# Patient Record
Sex: Female | Born: 1970 | Race: White | Hispanic: No | Marital: Married
Health system: Midwestern US, Academic
[De-identification: ages and names within clinical notes are randomized; demographics above are authoritative.]

---

## 2016-02-08 IMAGING — MG MAMMO DIGITAL SCREEN BILAT
4 series · 4 of 4 positions shown · non-contrast
Comparison: None.

` 
EXAM: Bilateral digital screening mammogram with computer-aided detection (CAD).` 
`
INDICATION: Screening mammogram.` 

[R CC]
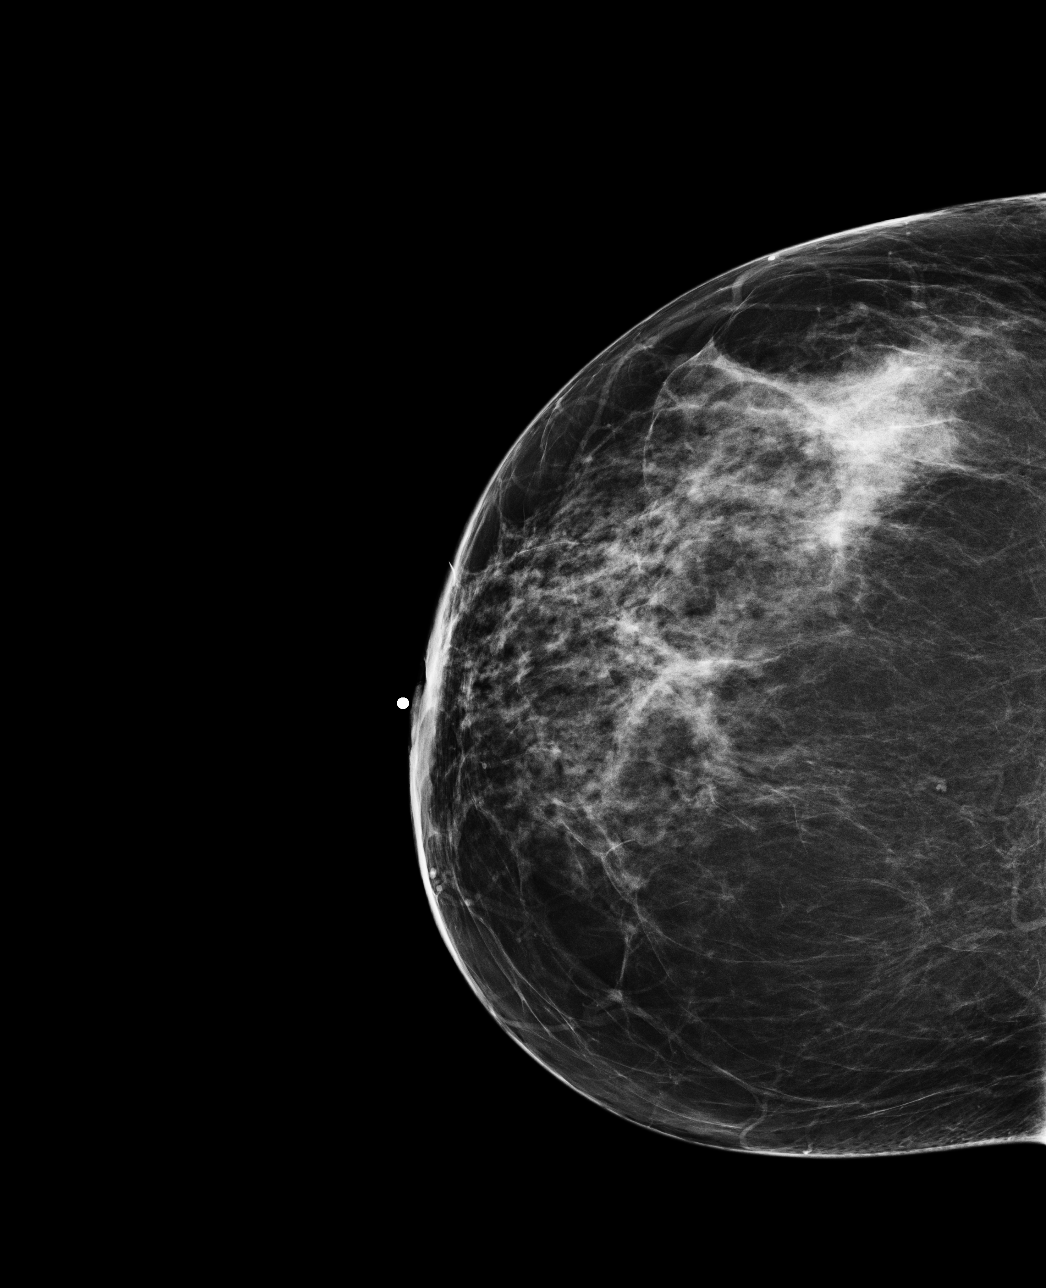

[L CC]
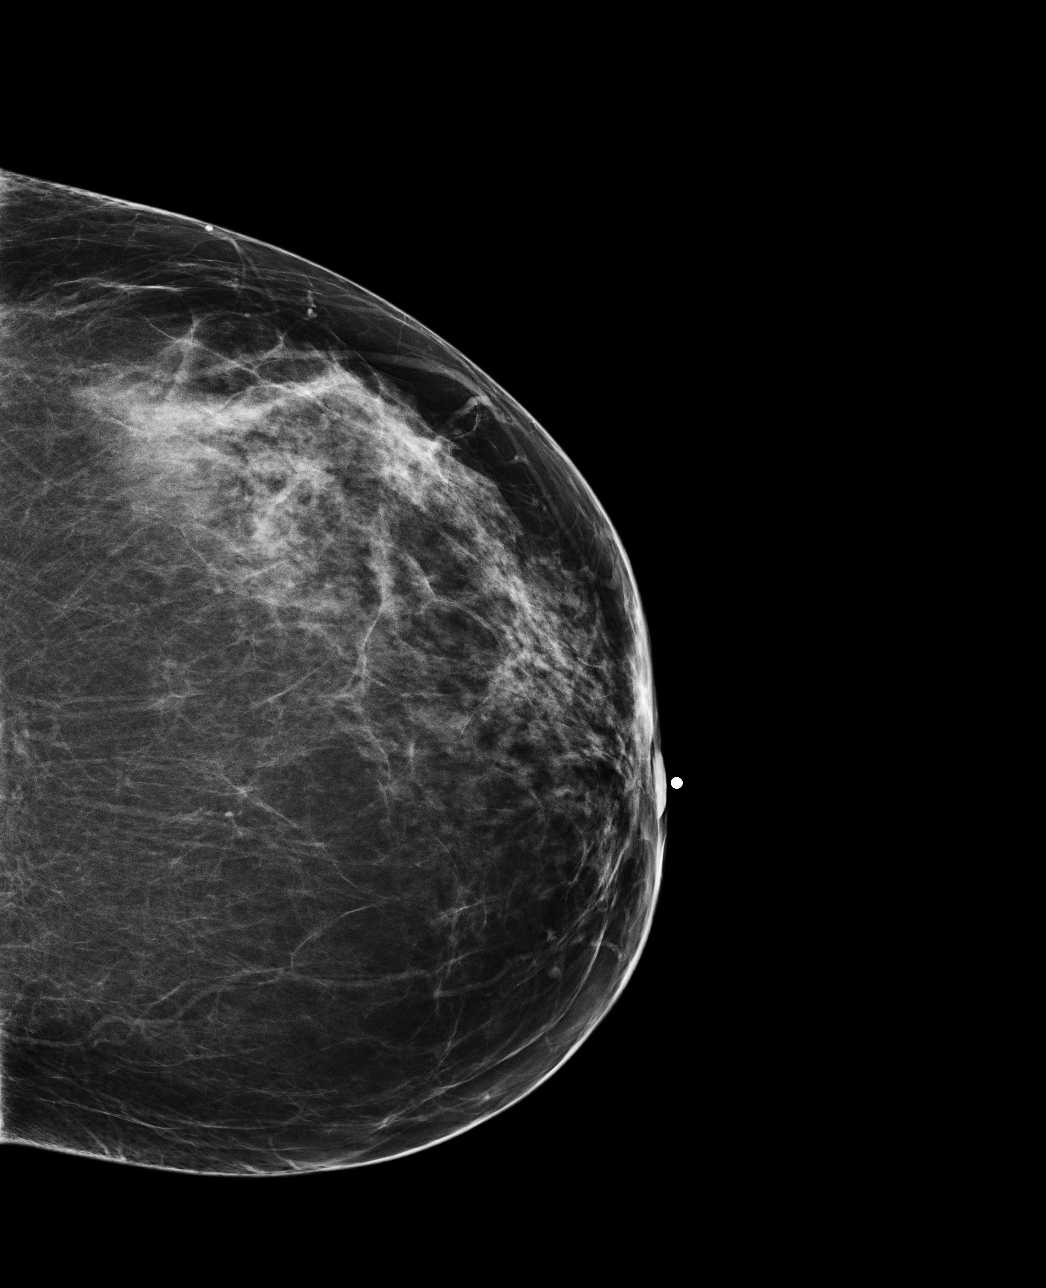

[L MLO]
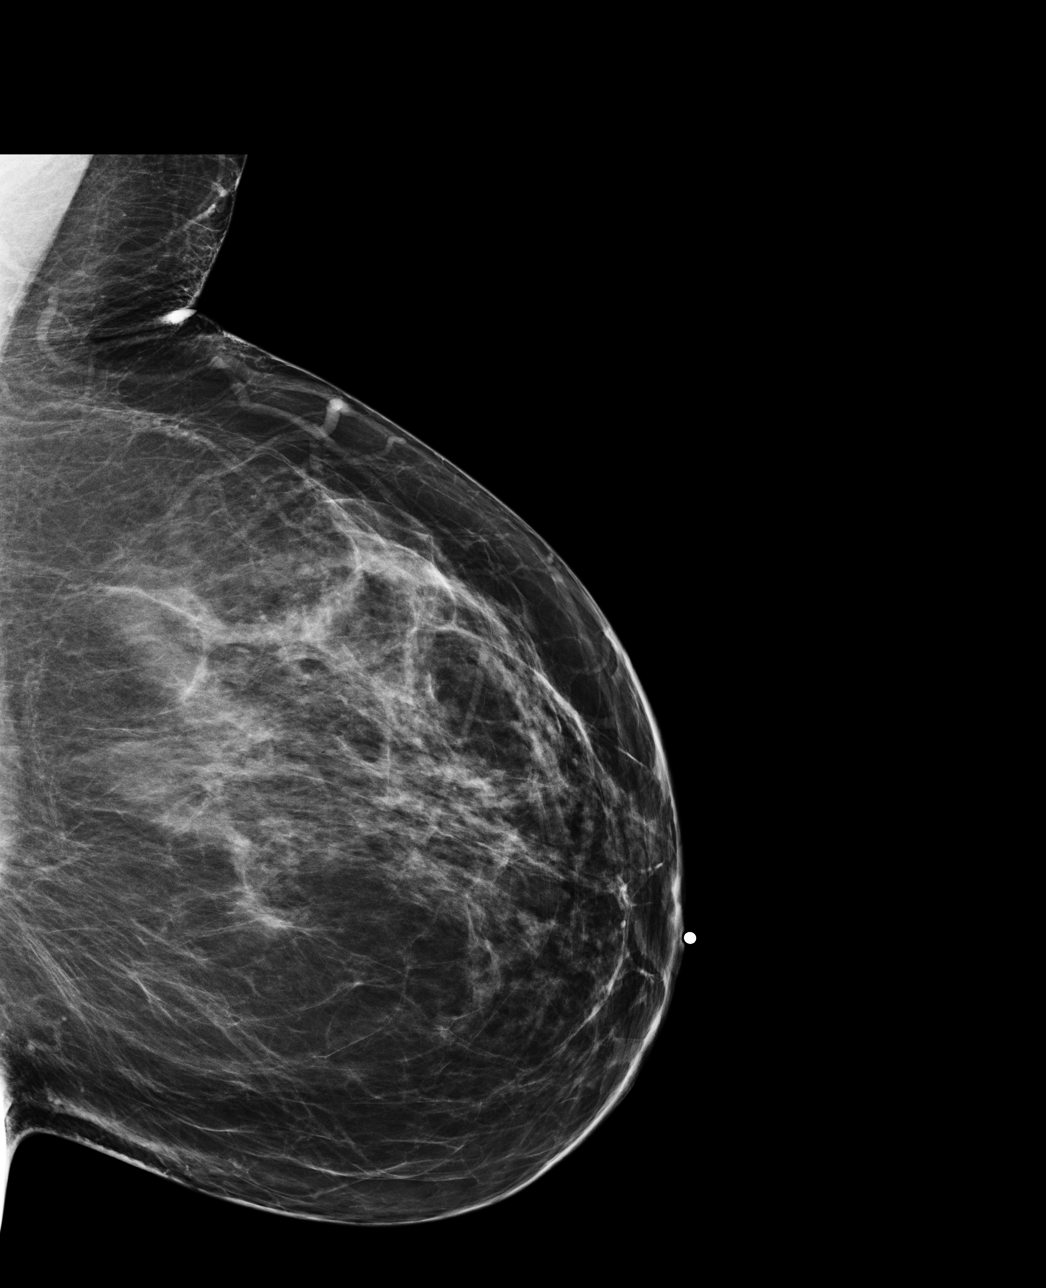

[R MLO]
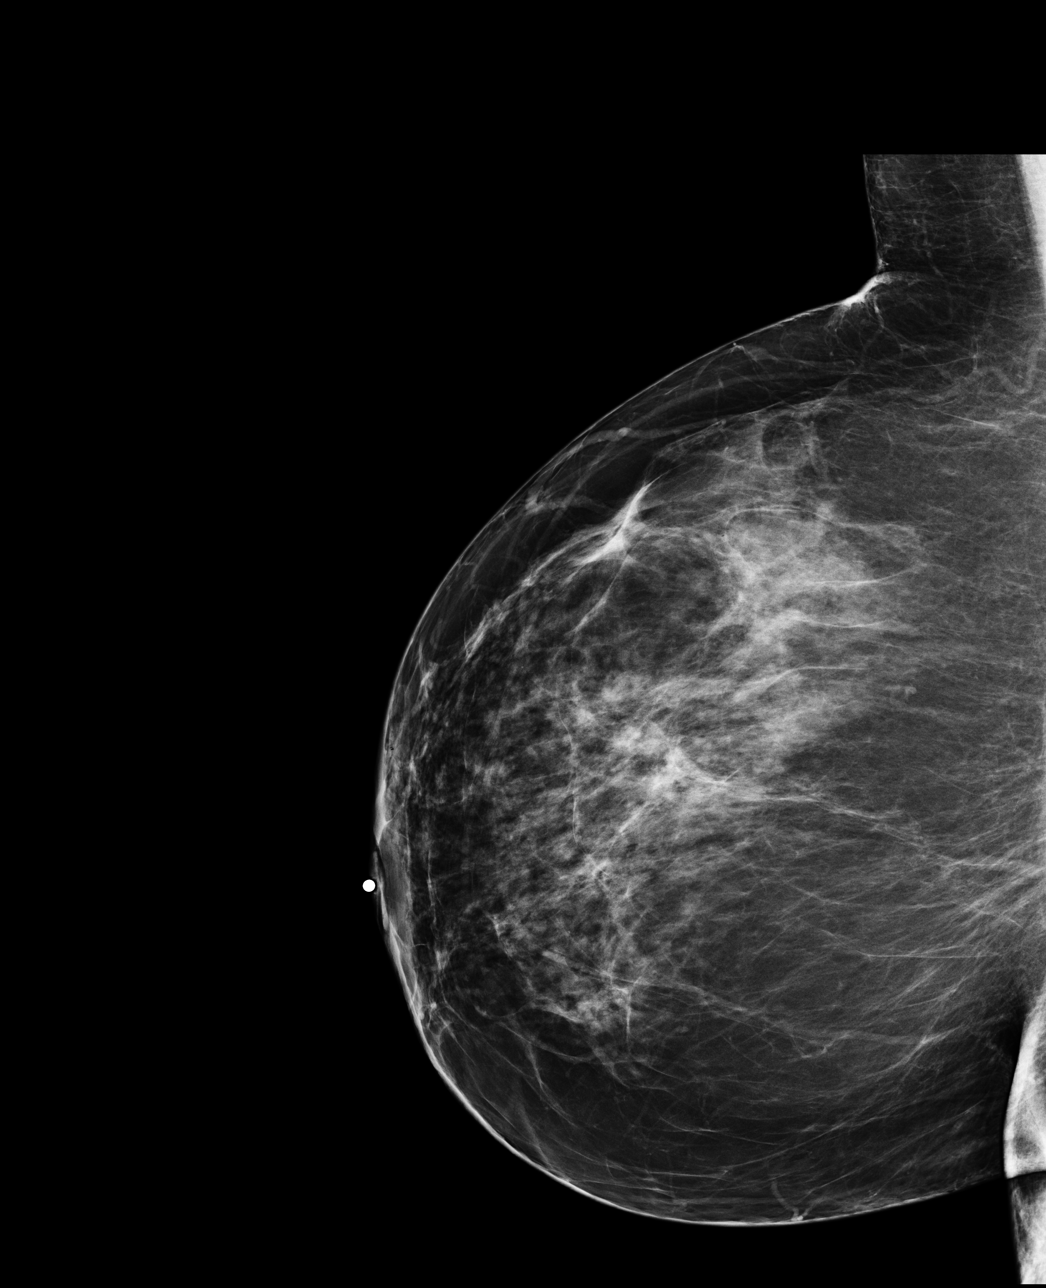

[4 of 4 positions shown; findings below may reference images not displayed]

Baseline exam.` 
` 
PROCEDURE: Bilateral MLO and CC digital mammogram views obtained with CAD.` 
`
FINDINGS: `
Breast Density: 25-50% B.`
There are scattered fibroglandular densities identified bilaterally. No evidence of spiculated mass or `
malignant calcifications present in either breast. Nipples, skin and axilla are unremarkable.`
`
IMPRESSION: No mammographic findings suspicious for malignancy in either breast. Annual screening ` 
mammograms are recommended.`
` 
BI RADS 1- Negative`
` 
clinically suspicious abnormality."`
` 
` 
` 
Baseline screening exam.`

## 2016-06-18 ENCOUNTER — Ambulatory Visit: Admit: 2016-06-18 | Discharge: 2016-06-19 | Payer: MEDICAID

## 2016-06-18 ENCOUNTER — Encounter: Admit: 2016-06-18 | Discharge: 2016-06-18 | Payer: BC Managed Care – PPO

## 2016-06-18 DIAGNOSIS — I10 Essential (primary) hypertension: ICD-10-CM

## 2016-06-18 DIAGNOSIS — B059 Measles without complication: ICD-10-CM

## 2016-06-18 DIAGNOSIS — B009 Herpesviral infection, unspecified: ICD-10-CM

## 2016-06-18 DIAGNOSIS — D699 Hemorrhagic condition, unspecified: Principal | ICD-10-CM

## 2016-06-18 DIAGNOSIS — G43709 Chronic migraine without aura, not intractable, without status migrainosus: ICD-10-CM

## 2016-06-18 DIAGNOSIS — B999 Unspecified infectious disease: ICD-10-CM

## 2016-06-18 DIAGNOSIS — N390 Urinary tract infection, site not specified: ICD-10-CM

## 2016-06-18 DIAGNOSIS — J45909 Unspecified asthma, uncomplicated: ICD-10-CM

## 2016-06-18 DIAGNOSIS — D649 Anemia, unspecified: ICD-10-CM

## 2016-06-18 MED ORDER — BOTULINUM TOXIN TYPE A 100 UNIT/ML INJECTION (OR)
200 [IU] | Freq: Once | 0 refills | Status: CP
Start: 2016-06-18 — End: ?
  Administered 2016-06-18: 14:00:00 200 [IU]

## 2016-06-18 NOTE — Progress Notes
Subjective: Audrey Hubbard is a 45 y.o. female here today for Botox for chronic migraines.     This is Botox injection #2.  At last visit she noted 20 migraine days per month.  Please see previous note from 02/26/16 and 03/26/16 for details.  She says that she had 2-3 severe migraines. She says other than that, she has had headache free days up to 8 days per month, but she says that recently this has been triggered by allergies. She says in the last week with wearing off, she really noted a big difference in her intensity of migraines.     Do you understand the risks of botox as discussed in the consent and wish to proceed?   Patient states yes.      Are you currently pregnant or trying to become pregnant, as botox is not indicated while pregnant? Patient states no.    Vitals:    06/18/16 0916   BP: 141/84   Pulse: 73   SpO2: 100%   Weight: 106.1 kg (234 lb)   Height: 152.4 cm (60)       Botox Injection Procedure  Previous Injection Date: 03/26/16  Information given verbally to the patient  today  included, but was not limited to:    Most common side effects: neck pain, headache, eyelid ptosis, migraine, muscular weakness, musculoskeletal stiffness, bronchitis, injection-site pain, musculoskeletal pain, myalgia, facial paresis, hypertension, muscle spasms; infection at injections sites, bruising, bleeding    Most serious side effects/risks: anaphylaxis, dysphagia,  arrhythmia, myocardial infarction, and in some cases, spontaneous death    Consent form was signed.    Confirmed: patient, procedure, side, site, safety procedures followed.     Performed by: Virgel Bouquet, MD     Preparation: no contraindications noted to Botox, possible medications prior to procedure:  topical numbing cream prior to procedure, Preparation of site with alcohol    Procedure performed:   Indication: Chronic Migraine Headaches  Medication: Onabotulinum toxin A  2.84ml normal saline as diluent /100 units (Botulinum Toxin 5 units per 0.55mL, 200 units prepared, 155 units used, 45 units of waste)    Location: PREEMPT protocol Jerrell Belfast SK, and coauthors. Cephalalgia, 2010;30:793)  Muscles/Sites Injected  A - Bilateral Corrugator - 10 units divided in 2 sites  B - Midline Procerus - 5 units in 1 site  C - Bilateral Frontalis - 20 units divided in 4 sites  D - Bilateral Temporalis - 40 units divided in 8 sites  E - Bilateral Occipitalis - 30 units divided in 6 sites  F - Bilateral Cervical Paraspinals - 20 units divided in 4 sites  G - Bilateral Trapezius - 30 units divided in 6 sites    Total Dose - 155 Units divided in 31 sites    Lot/Expiration: [id number], 12/2018 - both vials    Procedure tolerated: well.    Complications: none.  Diagnosis - Chronic Migraine Headaches   Course:  Progressing as expected.    Counseled: Patient/Family, Regarding diagnosis, Regarding treatment, Regarding medications. If any serious side effects occur, the patient has been instructed to go to the nearest emergency room and call our office.    Follow up: as scheduled prior to next Botox administration.

## 2016-06-19 DIAGNOSIS — G43709 Chronic migraine without aura, not intractable, without status migrainosus: Principal | ICD-10-CM

## 2016-09-16 ENCOUNTER — Encounter: Admit: 2016-09-16 | Discharge: 2016-09-16 | Payer: BC Managed Care – PPO

## 2016-09-16 DIAGNOSIS — G43709 Chronic migraine without aura, not intractable, without status migrainosus: Principal | ICD-10-CM

## 2016-09-16 NOTE — Telephone Encounter
Returned pt's message requesting to reschedule last Botox appt and update with new insurance coverage information. Pt rescheduled for 09/24/16 @ 1030 with arrival for check in @ 1015. Pt currently has [employer] [place] as primary coverage and [facility]. Provided pt Virtual Registration phone # and pt will call today to update insurance payor information.

## 2016-09-24 ENCOUNTER — Ambulatory Visit: Admit: 2016-09-24 | Discharge: 2016-09-25 | Payer: BC Managed Care – PPO

## 2016-09-24 ENCOUNTER — Encounter: Admit: 2016-09-24 | Discharge: 2016-09-24 | Payer: BC Managed Care – PPO

## 2016-09-24 DIAGNOSIS — D649 Anemia, unspecified: ICD-10-CM

## 2016-09-24 DIAGNOSIS — B999 Unspecified infectious disease: ICD-10-CM

## 2016-09-24 DIAGNOSIS — B009 Herpesviral infection, unspecified: ICD-10-CM

## 2016-09-24 DIAGNOSIS — D699 Hemorrhagic condition, unspecified: Principal | ICD-10-CM

## 2016-09-24 DIAGNOSIS — G43709 Chronic migraine without aura, not intractable, without status migrainosus: ICD-10-CM

## 2016-09-24 DIAGNOSIS — N390 Urinary tract infection, site not specified: ICD-10-CM

## 2016-09-24 DIAGNOSIS — B059 Measles without complication: ICD-10-CM

## 2016-09-24 DIAGNOSIS — I10 Essential (primary) hypertension: ICD-10-CM

## 2016-09-24 DIAGNOSIS — J45909 Unspecified asthma, uncomplicated: ICD-10-CM

## 2016-09-24 MED ORDER — BOTULINUM TOXIN TYPE A 100 UNIT/ML INJECTION (OR)
200 [IU] | Freq: Once | 0 refills | Status: CP
Start: 2016-09-24 — End: ?
  Administered 2016-09-24: 16:00:00 200 [IU]

## 2016-09-24 NOTE — Progress Notes
Subjective: Audrey Hubbard is a 46 y.o. female here today for Botox for chronic migraines.    Botox Injection #3.   Prior to botox 20 migraines per month.   Last visit after botox #1: 2-3 severe migraines, then headache free days 8 days per month. Intensity was much worse with botox wearing off in week prior to injections.   This visit: She notes with this round she did well with only 2-3 total severe days until wearing off. She had a week of nearly solid migraines. She feels that she is doing really well now. She has fewer daily underlying headaches with just pressure in the frontal area. She does wear glasses, but her astigmatism is worse when she is tired. She says that this sometimes leads to her headaches. She also feels that this interferes with her sleep/wake cycle. She is off her medications right now due to her insurance issues. She does have a higher BP today due to this. She is working on getting in with her PCP soon.     Do you understand the risks of botox as discussed in the consent and wish to proceed?   Patient states yes.    Are you currently pregnant or trying to become pregnant, as botox is not indicated while pregnant? Patient states no.    Vitals:    09/24/16 1042   BP: 150/84   Pulse: 79   SpO2: 98%       Botox Injection Procedure  Previous Injection Date: 06/18/16    Information given verbally to the patient  today  included, but was not limited to:    Most common side effects: neck pain, headache, eyelid ptosis, migraine, muscular weakness, musculoskeletal stiffness, bronchitis, injection-site pain, musculoskeletal pain, myalgia, facial paresis, hypertension, muscle spasms; infection at injections sites, bruising, bleeding    Most serious side effects/risks: anaphylaxis, dysphagia,  arrhythmia, myocardial infarction, and in some cases, spontaneous death    Consent form was signed.    Confirmed: patient, procedure, side, site, safety procedures followed.     Performed by: Virgel Bouquet, MD Preparation: no contraindications noted to Botox, possible medications prior to procedure:  topical numbing cream prior to procedure, Preparation of site with alcohol    Procedure performed:   Indication: Chronic Migraine Headaches  Medication: Onabotulinum toxin A  2.72ml normal saline as diluent /100 units (Botulinum Toxin 5 units per 0.69mL, 200 units prepared, 155 units used, 45 units of waste)    Location: PREEMPT protocol Jerrell Belfast SK, and coauthors. Cephalalgia, 2010;30:793)  Muscles/Sites Injected  A - Bilateral Corrugator - 10 units divided in 2 sites  B - Midline Procerus - 5 units in 1 site  C - Bilateral Frontalis - 20 units divided in 4 sites  D - Bilateral Temporalis - 40 units divided in 8 sites  E - Bilateral Occipitalis - 30 units divided in 6 sites  F - Bilateral Cervical Paraspinals - 20 units divided in 4 sites  G - Bilateral Trapezius - 30 units divided in 6 sites    Total Dose - 155 Units divided in 31 sites    Lot/Expiration: [id number], 01/2019 - both vials    Procedure tolerated: well.    Complications: none.  Diagnosis - Chronic Migraine Headaches   Course:  Progressing as expected.    Counseled: Patient/Family, Regarding diagnosis, Regarding treatment, Regarding medications. If any serious side effects occur, the patient has been instructed to go to the nearest emergency room and call our office.  Follow up: as scheduled prior to next Botox administration.

## 2016-09-25 DIAGNOSIS — G43709 Chronic migraine without aura, not intractable, without status migrainosus: Principal | ICD-10-CM

## 2016-10-24 NOTE — Progress Notes
Subjective:       History of Present Illness    Audrey Hubbard 46 y.o. female is here today for evaluation of headaches.   The patient was a prior patient of Dr. Bryan Lemma in our department.     Onset: childhood  Average number of headache days per month: 20 --> with botox 3-4 severe migraines per month  Location: occipital to frontal - either side, or bilateral  Quality: throbbing vs sharp/shooting  Light and sound sensitivity: positive  Nausea and/or vomiting: some nausea  Triggers: vision changes/astigmatism  Depression or anxiety: denies other than anxiety with her job right now.  She says her blood pressure is much lower 130s/85 at work, but has been low at work. She has not been able to tolerate the bystolic.   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     Abortive tx:  Oxycodone/acetaminophen  Darvacet  Naproxen 500mg   Advil  Sumatriptan - heart racing, light headed, dizzy  Ketorolac 15.75mg  nasal spray - does not take frequently, only works some of the time - stopped this due to frequent naproxen use  Compazine 10mg   Zofran 4mg     PPx tried/length of trial:  Lisinopril (for bp) caused cough  Metoprolol  Topamax - caused emotional changes  Zonegran 50mg  - She says that she stopped this, but she doesn't remember why.  Clonidine -for bp  Doxepin - more for sleep in the past.   Bystolic 10mg  daily - for bp  Magnesium  Botox  She says that she does not want to take any antidepressants. She says that antidepressants upset her more.       MRI brain w/o contrast 08/17/14: normal MRI of the brain.    __________________________________________________________________________________    Past Medical History:   Diagnosis Date   ??? Anemia    ??? Asthma    ??? Bleeding disorder (HCC)    ??? Chronic migraine    ??? Herpes    ??? Hypertension    ??? Infection    ??? Measles    ??? Recurrent UTI      Past Surgical History:   Procedure Laterality Date ??? CHOLECYSTECTOMY  2013   ??? CYSTOSCOPY     ??? EYE SURGERY      foreign body removal age 48.    ??? KNEE CARTILAGE SURGERY  at age 38     Social History     Social History   ??? Marital status: Married     Spouse name: N/A   ??? Number of children: N/A   ??? Years of education: N/A     Occupational History   ??? Not on file.     Social History Main Topics   ??? Smoking status: Never Smoker   ??? Smokeless tobacco: Never Used   ??? Alcohol use 1.8 oz/week     2 Glasses of wine, 1 Cans of beer per week      Comment: occasionally monthly   ??? Drug use: No   ??? Sexual activity: Not on file     Other Topics Concern   ??? Not on file     Social History Narrative   ??? No narrative on file     Family History   Problem Relation Age of Onset   ??? Stroke Mother 56   ??? Hypertension Mother    ??? High Cholesterol Mother    ??? Other Brother  cardiac ablation   ??? Hypertension Father    ??? Diabetes Father    ??? Hypoglycemia Father    ??? Arthritis Maternal Grandmother    ??? Tuberculosis Maternal Grandmother    ??? Heart problem Paternal Grandmother    ??? Dementia Paternal Grandfather      Allergies   Allergen Reactions   ??? Amoxicillin HIVES   ??? Erythromycin VOMITING and UNKNOWN   ??? Penicillins HIVES   ??? Zithromax [Azithromycin] HIVES and ITCHING   ??? Seasonal Allergies UNKNOWN   ??? Topamax [Topiramate] SEE COMMENTS     Crying, word vomiting        Review of Systems   Constitutional: Negative.    HENT: Negative.    Eyes: Positive for photophobia.   Respiratory: Negative.    Cardiovascular: Negative.    Gastrointestinal: Negative.    Genitourinary: Negative.    Musculoskeletal: Positive for arthralgias and back pain.   Skin: Negative.    Allergic/Immunologic: Positive for environmental allergies and food allergies.   Neurological: Positive for headaches.   Psychiatric/Behavioral: Negative.      Objective:         ??? BYSTOLIC 10 mg tablet TAKE 1 TABLET BY MOUTH DAILY   ??? cetirizine-psuedoephedrine (ZYRTEC-D) 5-120 mg tablet Take 1 Tab by mouth as Needed. ??? cloNIDine HCl (CATAPRESS) 0.1 mg tablet Take 0.1 mg by mouth twice daily.   ??? dexlansoprazole (+) (DEXILANT) 60 mg capsule Take 60 mg by mouth daily.   ??? fluticasone (FLONASE) 50 mcg/actuation nasal spray Apply 1 Spray to each nostril as directed daily. Shake bottle gently before using.   ??? hydrochlorothiazide (HYDRODIURIL) 25 mg tablet Take 25 mg by mouth twice daily.   ??? LEG BRACE (ANKLE BRACE MISC) 1 Applicator. 1 application (s) as directed   ??? montelukast (SINGULAIR) 10 mg tablet Take 10 mg by mouth at bedtime daily.   ??? naproxen (NAPROSYN) 500 mg tablet Take 500 mg by mouth every 12 hours as needed for Pain. Take with food.   ??? nitrofurantoin monohyd/m-cryst (MACROBID) 100 mg capsule Take 100 mg by mouth every 12 hours. Take with food.   ??? ondansetron hcl (ZOFRAN) 4 mg tablet Take 4 mg by mouth every 8 hours as needed for Nausea or Vomiting.   ??? other medication 1 Dose. Plexus slim, plexus bio cleanse, plexus mega-x, plexas probio 5, plexus x factor, plexus ease, plexus nerve   ??? oxyCODONE/acetaminophen (PERCOCET; ENDOCET; ROXICET) 5/325 mg tablet Take 1 Tab by mouth twice daily as needed for Pain   ??? potassium chloride SR (K-DUR) 10 mEq tablet Take 10 mEq by mouth as Needed. Take with a meal and a full glass of water.   ??? prochlorperazine maleate (COMPAZINE) 10 mg tablet Take 1 Tab by mouth every 8 hours as needed.   ??? valACYclovir (VALTREX) 1 gram tablet Take 1,000 mg by mouth as Needed.     Vitals:    10/28/16 0758   BP: 148/80   Pulse: 79   SpO2: 100%   Weight: 116.3 kg (256 lb 6.4 oz)   Height: 152.4 cm (60)     Body mass index is 50.07 kg/m???.     Physical Exam    General: alert, oriented x 3  Speech: normal, no dysarthria  Cardiovascular: regular rate and rhythm  Lungs: clear to auscultation  Ext: no edema  Cranial nerves: ophthalmoscopic exam: no papilledema or optic pallor; II Visual fields full to finger counting; III, IV, VI PERRL, extraocular muscles intact, no nystagmus,  right ptosis; V facial sensation intact; VII facial expression symmetric; VIII hearing intact to conversation; IX, X palate rise symmetric; XI sternocleidomastoid strength intact; XII tongue midline  Motor: (Right/Left)  Deltoid 5/5, Biceps 5/5, Triceps 5/5, Finger ext 5/5, interossei 5/5, Hip Flexion 5/5, Knee ext 5/5, Knee flex 5/5, Ankle dorsiflexion 5/5  Reflexes: (Right/Left) Biceps 2/2, Triceps 2/2, Brachioradialis 2/2, Knee Jerks 2/2  Abnormal Movements: no tremors, no rigidity  Gait: normal based, good arm swing       Assessment and Plan:  Audrey Hubbard is a 46 y.o. female who presents for evaluation of headache. She notes h/o chronic migraine since childhood, has been 15-20 or more days per month, unilateral to bilateral throbbing/sharp pain, + photo/phono, + nausea.     1. Chronic migraine w/o aura w/o status migrainosus, not intractable       RECOMMENDATIONS  -- Continue botox every 12 weeks  -- Discussed gammacore, but will wait until her blood pressure is under better control.   -- Please work with your PCP on your blood pressure (as discussed you are working on already).  -- When needed for severe headaches, trial of naproxen 500mg , compazine 5-10mg , benadryl 25mg , magnesium 400mg  (no more than 10/month)    FOLLOWUP PLAN  Return in about 8 months (around 06/28/2017) for follow up headaches.  The patient is instructed to contact me if there are any concerns with the agreed plan.

## 2016-10-28 ENCOUNTER — Ambulatory Visit: Admit: 2016-10-28 | Discharge: 2016-10-29 | Payer: BC Managed Care – PPO

## 2016-10-28 ENCOUNTER — Encounter: Admit: 2016-10-28 | Discharge: 2016-10-28 | Payer: BC Managed Care – PPO

## 2016-10-28 DIAGNOSIS — D649 Anemia, unspecified: ICD-10-CM

## 2016-10-28 DIAGNOSIS — B059 Measles without complication: ICD-10-CM

## 2016-10-28 DIAGNOSIS — B999 Unspecified infectious disease: ICD-10-CM

## 2016-10-28 DIAGNOSIS — N390 Urinary tract infection, site not specified: ICD-10-CM

## 2016-10-28 DIAGNOSIS — G43709 Chronic migraine without aura, not intractable, without status migrainosus: ICD-10-CM

## 2016-10-28 DIAGNOSIS — B009 Herpesviral infection, unspecified: ICD-10-CM

## 2016-10-28 DIAGNOSIS — J45909 Unspecified asthma, uncomplicated: ICD-10-CM

## 2016-10-28 DIAGNOSIS — D699 Hemorrhagic condition, unspecified: Principal | ICD-10-CM

## 2016-10-28 DIAGNOSIS — I10 Essential (primary) hypertension: ICD-10-CM

## 2016-10-29 DIAGNOSIS — G43709 Chronic migraine without aura, not intractable, without status migrainosus: Principal | ICD-10-CM

## 2016-12-11 NOTE — Progress Notes
Subjective: Audrey Hubbard 46 y.o. female is here today for botox for chronic migraines.      This is Botox No. 4.  Prior to Botox she had 20 migraines per month. After injections, she had only 2-3 total severe migraines until wearing off in the last 1 week with nearly solid headaches.   She had some still daily underlying headaches or pressure in the frontal area.  We did discuss the potential to try gamma core, but wanted to make sure her blood pressure was under better control before we did so.  She is working on her blood pressure control.     Do you understand the risks and benefits of botox for migraine and wish to proceed?  Yes.    Vitals:    12/16/16 1902   BP: 148/70   Pulse: 82   Resp: 16   Weight: 106.1 kg (234 lb)   Height: 152.4 cm (60)     Botox Injection Procedure   Previous Injection Date: 09/24/16  Informed consent given verbally to the patient today to include, but not limited to:  Most common side effects: neck pain, headache, eyelid ptosis, migraine, muscular weakness, musculoskeletal stiffness, bronchitis, injection-site pain, musculoskeletal pain, myalgia, facial paresis, hypertension, and muscle spasms; infection at injections sites, bruising, bleeding  Most serious side effects/risks: anaphylaxis, dysphagia, pneumonia, arrhythmia, myocardial infarction, and in some cases, spontaneous death    Confirmed: patient, procedure, side, site, safety procedures followed.     Performed by: Marvis Moeller, MD.     Preparation: no contraindications noted to Botox, possible medications prior to procedure Emla cream 2.5%/2.5% - 2g topically prior to procedure, Tylenol, Preparation of site with alcohol    Procedure performed:   Indication: Chronic Migraine Headaches  Medication: Onabotulinum toxin A  2.5 ml/100 units (Botulinum Toxin 5 units per 0.90mL, 200 units prepared, 165 units used, 35 units of waste)    Location: PREEMPT protocol Jerrell Belfast SK, and coauthors. Cephalalgia, 2010;30:793) Muscles/Sites Injected  A - Bilateral Corrugator - 10 units divided in 2 sites  B - Midline Procerus - 5 units in 1 site  C - Bilateral Frontalis - 20 units divided in 4 sites  D - Bilateral Temporalis - 45 units divided in 8 sites - extra 5 in right temporalis  E - Bilateral Occipitalis - 30 units divided in 6 sites  F - Bilateral Cervical Paraspinals - 20 units divided in 4 sites  G - Bilateral Trapezius - 35 units divided in 6 sites - extra 5 in left trapezius    Total Dose - 165 Units divided in 31 sites    Lot/Expiration: [id number], 04/2019 - both vials    Procedure tolerated: well.     Complications: none.    Diagnosis ??? Chronic Migraine Headaches   Course:  Progressing as expected.    Counseled:  Patient/Family, Regarding diagnosis, Regarding treatment, Regarding medications. If any serious side effects occur, the patient has been instructed to go to the nearest emergency room and call our office.  Follow up: as scheduled - call with any concerns

## 2016-12-16 ENCOUNTER — Encounter: Admit: 2016-12-16 | Discharge: 2016-12-16 | Payer: BC Managed Care – PPO

## 2016-12-16 DIAGNOSIS — G43709 Chronic migraine without aura, not intractable, without status migrainosus: ICD-10-CM

## 2016-12-16 DIAGNOSIS — D649 Anemia, unspecified: ICD-10-CM

## 2016-12-16 DIAGNOSIS — J45909 Unspecified asthma, uncomplicated: ICD-10-CM

## 2016-12-16 DIAGNOSIS — B059 Measles without complication: ICD-10-CM

## 2016-12-16 DIAGNOSIS — B009 Herpesviral infection, unspecified: ICD-10-CM

## 2016-12-16 DIAGNOSIS — N390 Urinary tract infection, site not specified: ICD-10-CM

## 2016-12-16 DIAGNOSIS — D699 Hemorrhagic condition, unspecified: Principal | ICD-10-CM

## 2016-12-16 DIAGNOSIS — I10 Essential (primary) hypertension: ICD-10-CM

## 2016-12-16 DIAGNOSIS — B999 Unspecified infectious disease: ICD-10-CM

## 2016-12-16 MED ORDER — BOTULINUM TOXIN TYPE A 100 UNIT/ML INJECTION (OR)
200 [IU] | Freq: Once | 0 refills | Status: CP
Start: 2016-12-16 — End: ?
  Administered 2016-12-17: 01:00:00 200 [IU]

## 2016-12-17 ENCOUNTER — Ambulatory Visit: Admit: 2016-12-17 | Discharge: 2016-12-17 | Payer: BC Managed Care – PPO

## 2016-12-17 DIAGNOSIS — G43709 Chronic migraine without aura, not intractable, without status migrainosus: Principal | ICD-10-CM

## 2017-03-17 ENCOUNTER — Ambulatory Visit: Admit: 2017-03-17 | Discharge: 2017-03-18 | Payer: BC Managed Care – PPO

## 2017-03-17 ENCOUNTER — Encounter: Admit: 2017-03-17 | Discharge: 2017-03-17 | Payer: BC Managed Care – PPO

## 2017-03-17 DIAGNOSIS — I10 Essential (primary) hypertension: ICD-10-CM

## 2017-03-17 DIAGNOSIS — B999 Unspecified infectious disease: ICD-10-CM

## 2017-03-17 DIAGNOSIS — N390 Urinary tract infection, site not specified: ICD-10-CM

## 2017-03-17 DIAGNOSIS — B059 Measles without complication: ICD-10-CM

## 2017-03-17 DIAGNOSIS — D699 Hemorrhagic condition, unspecified: Principal | ICD-10-CM

## 2017-03-17 DIAGNOSIS — J45909 Unspecified asthma, uncomplicated: ICD-10-CM

## 2017-03-17 DIAGNOSIS — D649 Anemia, unspecified: ICD-10-CM

## 2017-03-17 DIAGNOSIS — G43709 Chronic migraine without aura, not intractable, without status migrainosus: ICD-10-CM

## 2017-03-17 DIAGNOSIS — B009 Herpesviral infection, unspecified: ICD-10-CM

## 2017-03-17 MED ORDER — BOTULINUM TOXIN TYPE A 100 UNIT/ML INJECTION (OR)
200 [IU] | Freq: Once | 0 refills | Status: CP
Start: 2017-03-17 — End: ?

## 2017-03-18 DIAGNOSIS — G43709 Chronic migraine without aura, not intractable, without status migrainosus: Principal | ICD-10-CM

## 2017-06-11 ENCOUNTER — Encounter: Admit: 2017-06-11 | Discharge: 2017-06-11 | Payer: BC Managed Care – PPO

## 2017-06-11 DIAGNOSIS — N390 Urinary tract infection, site not specified: ICD-10-CM

## 2017-06-11 DIAGNOSIS — D649 Anemia, unspecified: ICD-10-CM

## 2017-06-11 DIAGNOSIS — H21501 Unspecified adhesions of iris, right eye: ICD-10-CM

## 2017-06-11 DIAGNOSIS — J45909 Unspecified asthma, uncomplicated: ICD-10-CM

## 2017-06-11 DIAGNOSIS — G43709 Chronic migraine without aura, not intractable, without status migrainosus: ICD-10-CM

## 2017-06-11 DIAGNOSIS — B059 Measles without complication: ICD-10-CM

## 2017-06-11 DIAGNOSIS — B009 Herpesviral infection, unspecified: ICD-10-CM

## 2017-06-11 DIAGNOSIS — B999 Unspecified infectious disease: ICD-10-CM

## 2017-06-11 DIAGNOSIS — I10 Essential (primary) hypertension: ICD-10-CM

## 2017-06-11 DIAGNOSIS — D699 Hemorrhagic condition, unspecified: Principal | ICD-10-CM

## 2017-06-11 MED ORDER — BOTULINUM TOXIN TYPE A 100 UNIT/ML INJECTION (OR)
200 [IU] | Freq: Once | 0 refills | Status: CP
Start: 2017-06-11 — End: ?
  Administered 2017-06-11: 13:00:00 200 [IU]

## 2017-06-12 ENCOUNTER — Ambulatory Visit: Admit: 2017-06-11 | Discharge: 2017-06-12 | Payer: BC Managed Care – PPO

## 2017-06-12 DIAGNOSIS — G43709 Chronic migraine without aura, not intractable, without status migrainosus: Principal | ICD-10-CM

## 2017-06-30 ENCOUNTER — Encounter: Admit: 2017-06-30 | Discharge: 2017-06-30 | Payer: BC Managed Care – PPO

## 2017-06-30 ENCOUNTER — Ambulatory Visit: Admit: 2017-06-30 | Discharge: 2017-07-01 | Payer: BC Managed Care – PPO

## 2017-06-30 DIAGNOSIS — I10 Essential (primary) hypertension: ICD-10-CM

## 2017-06-30 DIAGNOSIS — H21501 Unspecified adhesions of iris, right eye: ICD-10-CM

## 2017-06-30 DIAGNOSIS — G43709 Chronic migraine without aura, not intractable, without status migrainosus: ICD-10-CM

## 2017-06-30 DIAGNOSIS — B059 Measles without complication: ICD-10-CM

## 2017-06-30 DIAGNOSIS — J45909 Unspecified asthma, uncomplicated: ICD-10-CM

## 2017-06-30 DIAGNOSIS — B009 Herpesviral infection, unspecified: ICD-10-CM

## 2017-06-30 DIAGNOSIS — N390 Urinary tract infection, site not specified: ICD-10-CM

## 2017-06-30 DIAGNOSIS — D699 Hemorrhagic condition, unspecified: Principal | ICD-10-CM

## 2017-06-30 DIAGNOSIS — D649 Anemia, unspecified: ICD-10-CM

## 2017-06-30 MED ORDER — GABAPENTIN 100 MG PO CAP
100 mg | ORAL_CAPSULE | ORAL | 3 refills | Status: AC
Start: 2017-06-30 — End: ?

## 2017-09-03 ENCOUNTER — Encounter: Admit: 2017-09-03 | Discharge: 2017-09-03 | Payer: BC Managed Care – PPO

## 2017-09-03 ENCOUNTER — Ambulatory Visit: Admit: 2017-09-03 | Discharge: 2017-09-04 | Payer: BC Managed Care – PPO

## 2017-09-03 DIAGNOSIS — J45909 Unspecified asthma, uncomplicated: ICD-10-CM

## 2017-09-03 DIAGNOSIS — B059 Measles without complication: ICD-10-CM

## 2017-09-03 DIAGNOSIS — D699 Hemorrhagic condition, unspecified: Principal | ICD-10-CM

## 2017-09-03 DIAGNOSIS — N390 Urinary tract infection, site not specified: ICD-10-CM

## 2017-09-03 DIAGNOSIS — H21501 Unspecified adhesions of iris, right eye: ICD-10-CM

## 2017-09-03 DIAGNOSIS — G43709 Chronic migraine without aura, not intractable, without status migrainosus: Principal | ICD-10-CM

## 2017-09-03 DIAGNOSIS — D649 Anemia, unspecified: ICD-10-CM

## 2017-09-03 DIAGNOSIS — B009 Herpesviral infection, unspecified: ICD-10-CM

## 2017-09-03 DIAGNOSIS — I10 Essential (primary) hypertension: ICD-10-CM

## 2017-09-03 MED ORDER — ONABOTULINUMTOXINA 100 UNIT IJ SOLR
200 [IU] | Freq: Once | 0 refills | Status: CP
Start: 2017-09-03 — End: ?
  Administered 2017-09-03: 13:00:00 200 [IU]

## 2017-11-26 ENCOUNTER — Encounter: Admit: 2017-11-26 | Discharge: 2017-11-26 | Payer: BC Managed Care – PPO

## 2017-11-26 ENCOUNTER — Ambulatory Visit: Admit: 2017-11-26 | Discharge: 2017-11-27 | Payer: BC Managed Care – PPO

## 2017-11-26 DIAGNOSIS — J45909 Unspecified asthma, uncomplicated: ICD-10-CM

## 2017-11-26 DIAGNOSIS — D649 Anemia, unspecified: ICD-10-CM

## 2017-11-26 DIAGNOSIS — I10 Essential (primary) hypertension: ICD-10-CM

## 2017-11-26 DIAGNOSIS — B009 Herpesviral infection, unspecified: ICD-10-CM

## 2017-11-26 DIAGNOSIS — G43709 Chronic migraine without aura, not intractable, without status migrainosus: ICD-10-CM

## 2017-11-26 DIAGNOSIS — N390 Urinary tract infection, site not specified: ICD-10-CM

## 2017-11-26 DIAGNOSIS — B059 Measles without complication: ICD-10-CM

## 2017-11-26 DIAGNOSIS — D699 Hemorrhagic condition, unspecified: Principal | ICD-10-CM

## 2017-11-26 DIAGNOSIS — H21501 Unspecified adhesions of iris, right eye: ICD-10-CM

## 2017-11-26 MED ORDER — ONABOTULINUMTOXINA 100 UNIT IJ SOLR
200 [IU] | Freq: Once | 0 refills | Status: CP
Start: 2017-11-26 — End: ?
  Administered 2017-11-26: 15:00:00 200 [IU]

## 2017-11-27 DIAGNOSIS — G43709 Chronic migraine without aura, not intractable, without status migrainosus: Principal | ICD-10-CM

## 2018-02-23 ENCOUNTER — Encounter: Admit: 2018-02-23 | Discharge: 2018-02-23 | Payer: BC Managed Care – PPO

## 2018-02-23 ENCOUNTER — Ambulatory Visit: Admit: 2018-02-23 | Discharge: 2018-02-24 | Payer: BC Managed Care – PPO

## 2018-02-23 DIAGNOSIS — D699 Hemorrhagic condition, unspecified: Principal | ICD-10-CM

## 2018-02-23 DIAGNOSIS — N390 Urinary tract infection, site not specified: ICD-10-CM

## 2018-02-23 DIAGNOSIS — G43709 Chronic migraine without aura, not intractable, without status migrainosus: ICD-10-CM

## 2018-02-23 DIAGNOSIS — J45909 Unspecified asthma, uncomplicated: ICD-10-CM

## 2018-02-23 DIAGNOSIS — B059 Measles without complication: ICD-10-CM

## 2018-02-23 DIAGNOSIS — H21501 Unspecified adhesions of iris, right eye: ICD-10-CM

## 2018-02-23 DIAGNOSIS — D649 Anemia, unspecified: ICD-10-CM

## 2018-02-23 DIAGNOSIS — I10 Essential (primary) hypertension: ICD-10-CM

## 2018-02-23 DIAGNOSIS — B009 Herpesviral infection, unspecified: ICD-10-CM

## 2018-02-23 MED ORDER — ONABOTULINUMTOXINA 100 UNIT IJ SOLR
165 [IU] | Freq: Once | 0 refills | Status: CP
Start: 2018-02-23 — End: ?
  Administered 2018-02-23: 23:00:00 165 [IU]

## 2018-02-24 DIAGNOSIS — G43709 Chronic migraine without aura, not intractable, without status migrainosus: Principal | ICD-10-CM

## 2020-04-04 ENCOUNTER — Encounter: Admit: 2020-04-04 | Discharge: 2020-04-04 | Payer: BC Managed Care – PPO

## 2020-04-20 ENCOUNTER — Encounter: Admit: 2020-04-20 | Discharge: 2020-04-20 | Payer: BC Managed Care – PPO

## 2020-05-03 ENCOUNTER — Encounter: Admit: 2020-05-03 | Discharge: 2020-05-03 | Payer: BC Managed Care – PPO

## 2020-05-03 ENCOUNTER — Ambulatory Visit: Admit: 2020-05-03 | Discharge: 2020-05-04 | Payer: BC Managed Care – PPO

## 2020-05-03 DIAGNOSIS — G43009 Migraine without aura, not intractable, without status migrainosus: Secondary | ICD-10-CM

## 2020-05-03 DIAGNOSIS — I10 Essential (primary) hypertension: Secondary | ICD-10-CM

## 2020-05-03 DIAGNOSIS — R002 Palpitations: Secondary | ICD-10-CM

## 2020-05-03 DIAGNOSIS — B009 Herpesviral infection, unspecified: Secondary | ICD-10-CM

## 2020-05-03 DIAGNOSIS — B059 Measles without complication: Secondary | ICD-10-CM

## 2020-05-03 DIAGNOSIS — J45909 Unspecified asthma, uncomplicated: Secondary | ICD-10-CM

## 2020-05-03 DIAGNOSIS — N390 Urinary tract infection, site not specified: Secondary | ICD-10-CM

## 2020-05-03 DIAGNOSIS — IMO0002 Chronic migraine: Secondary | ICD-10-CM

## 2020-05-03 DIAGNOSIS — D699 Hemorrhagic condition, unspecified: Secondary | ICD-10-CM

## 2020-05-03 DIAGNOSIS — D649 Anemia, unspecified: Secondary | ICD-10-CM

## 2020-05-03 DIAGNOSIS — H21501 Unspecified adhesions of iris, right eye: Secondary | ICD-10-CM

## 2020-05-12 ENCOUNTER — Encounter: Admit: 2020-05-12 | Discharge: 2020-05-12 | Payer: BC Managed Care – PPO

## 2020-05-12 ENCOUNTER — Ambulatory Visit: Admit: 2020-05-12 | Discharge: 2020-05-12 | Payer: BC Managed Care – PPO

## 2020-05-12 DIAGNOSIS — I10 Essential (primary) hypertension: Secondary | ICD-10-CM

## 2020-05-12 DIAGNOSIS — R002 Palpitations: Secondary | ICD-10-CM

## 2020-05-12 NOTE — Progress Notes
Ordering Physician:JAT  Type of Monitor:Looping  Length of Study:30  Dx:HTN, palpitations  Device Serial Number: [id number]  enroll complete

## 2020-06-19 ENCOUNTER — Encounter: Admit: 2020-06-19 | Discharge: 2020-06-19 | Payer: BC Managed Care – PPO

## 2020-06-19 NOTE — Telephone Encounter
Left pt a detailed message on personal voicemail with heart monitor results. Cal back number provided if pt has any questions.

## 2020-06-19 NOTE — Telephone Encounter
-----   Message from Adonis Huguenin, MD sent at 06/19/2020  2:09 PM CDT -----  Her cardiac monitor was low risk.  No demonstration of any significant rhythms or issues.  Overall, her 30-day monitor was unremarkable.    Thanks,  JT

## 2020-06-30 ENCOUNTER — Encounter: Admit: 2020-06-30 | Discharge: 2020-06-30 | Payer: BC Managed Care – PPO

## 2020-06-30 ENCOUNTER — Ambulatory Visit: Admit: 2020-06-30 | Discharge: 2020-06-30 | Payer: BC Managed Care – PPO

## 2020-06-30 DIAGNOSIS — R002 Palpitations: Secondary | ICD-10-CM

## 2020-06-30 DIAGNOSIS — I10 Essential (primary) hypertension: Secondary | ICD-10-CM

## 2020-06-30 NOTE — Telephone Encounter
-----   Message from Adonis Huguenin, MD sent at 06/30/2020 12:46 PM CDT -----  Her echo looks pretty good.  Normal ejection fraction.  Normal axes on the heart.  The right-sided chambers look good.  The top chambers look good.  Her valves look pretty good with no abnormalities.  She had normal pressures on the right side of the heart.  All in all this is very comparable to her prior 2016 study with no significant changes.    Thanks,  JT

## 2020-06-30 NOTE — Telephone Encounter
Left a detailed message on pt's vm with echo results. Asked patient to call with any questions. Call back number provided.

## 2020-06-30 NOTE — Progress Notes
Her echo looks pretty good.  Normal ejection fraction.  Normal axes on the heart.  The right-sided chambers look good.  The top chambers look good.  Her valves look pretty good with no abnormalities.  She had normal pressures on the right side of the heart.  All in all this is very comparable to her prior 2016 study with no significant changes.    Thanks,  JT

## 2020-08-03 ENCOUNTER — Encounter: Admit: 2020-08-03 | Discharge: 2020-08-03 | Payer: BC Managed Care – PPO

## 2020-08-03 NOTE — Progress Notes
Patient: Audrey Hubbard, DOB: [birth year]    Requesting most recent blood work to be faxed to Dr. Maisie Fus    Please fax to  Attention: Dr. Maisie Fus at the [facility]    Fax number [phone]  Thank you.

## 2020-08-04 ENCOUNTER — Encounter: Admit: 2020-08-04 | Discharge: 2020-08-04 | Payer: BC Managed Care – PPO

## 2020-08-04 NOTE — Telephone Encounter
-----   Message from Connye Burkitt, RN sent at 08/03/2020  3:19 PM CDT -----  Regarding: FW: appointment/Labs    ----- Message -----  From: Inez Pilgrim  Sent: 08/03/2020   3:10 PM CDT  To: Cvm Nurse Gen Card Team Red  Subject: appointment/Labs                                 Good Afternoon        Patient has upcoming appointment with Dr. Maisie Fus on 08/14/2020. Open order for labs not completed. Please look into.      Thanks  [employer]

## 2020-08-04 NOTE — Telephone Encounter
Lab request mailed to patient to have completed prior to office visit.

## 2020-08-14 ENCOUNTER — Encounter: Admit: 2020-08-14 | Discharge: 2020-08-14 | Payer: BC Managed Care – PPO

## 2020-08-14 ENCOUNTER — Ambulatory Visit: Admit: 2020-08-14 | Discharge: 2020-08-14 | Payer: BC Managed Care – PPO

## 2020-08-14 DIAGNOSIS — B059 Measles without complication: Secondary | ICD-10-CM

## 2020-08-14 DIAGNOSIS — I10 Essential (primary) hypertension: Secondary | ICD-10-CM

## 2020-08-14 DIAGNOSIS — IMO0002 Chronic migraine: Secondary | ICD-10-CM

## 2020-08-14 DIAGNOSIS — R600 Localized edema: Secondary | ICD-10-CM

## 2020-08-14 DIAGNOSIS — N390 Urinary tract infection, site not specified: Secondary | ICD-10-CM

## 2020-08-14 DIAGNOSIS — D649 Anemia, unspecified: Secondary | ICD-10-CM

## 2020-08-14 DIAGNOSIS — B009 Herpesviral infection, unspecified: Secondary | ICD-10-CM

## 2020-08-14 DIAGNOSIS — D699 Hemorrhagic condition, unspecified: Secondary | ICD-10-CM

## 2020-08-14 DIAGNOSIS — H21501 Unspecified adhesions of iris, right eye: Secondary | ICD-10-CM

## 2020-08-14 DIAGNOSIS — J45909 Unspecified asthma, uncomplicated: Secondary | ICD-10-CM

## 2020-08-14 DIAGNOSIS — R002 Palpitations: Secondary | ICD-10-CM

## 2020-08-14 NOTE — Progress Notes
Date of Service: 08/14/2020    Audrey Hubbard is a 51 y.o. female.       HPI     Audrey Hubbard   presents to the [facility]/Department of Cardiovascular medicine for the following CV/non-CV issues:  1. palpitations  2. Dyspnea exertion  3. Chest pain  4. History of preeclampsia  5. Hypertension  6. Dyslipidemia  7. Elevated BMI  8. Questionable OSA  9. Shiftwork syndrome  10. Asymmetric lower extremity edema    Pleasant 50 year old female presenting to clinic in follow-up for history of tachypalpitations.    Reports improved burden of tachypalpitations.  Single episode since her last meeting lasting some 8 minutes.  Otherwise has been free from any burdensome dysrhythmias or tachypalpitations.  We did pursue a cardiac monitor over the summer.  Moderate deemed to be low risk.  No demonstrated episodes of dysrhythmias or significant ectopy.  No demonstration of any atrial fibrillation atrial flutter no SVT or VT.  Triggered events to correlate with the sinus rhythm.      She did undergo a echocardiogram as well.  The study was unrevealing for any significant burden of structural abnormality preserved EF normal right-sided structures.  No significant valve disease.  Peak PA systolic pressure within normal limits at 32 mmHg.    We did discuss pursuing stress test/ischemic evaluation given chest pressure/chest discomfort.  Unfortunate some ischemic issue and about arranging a CT PET MPI stress test.      Fortunately, has had no recurrence of any burdensome shortness of breath or exertional/typical angina.    Most pressing importance she did have a episode of lower extremity edema left worse than right status post long car ride.  Some suspicion for possible DVT.  No venous assessment.  Did have to undergo antibiotic therapy and diuretic increase TAVR is also degree of a cellulitis.    GENERAL: The patient is well developed, well nourished, resting comfortably and in no distress.   HEENT: No abnormalities of the visible oro-nasopharynx, conjunctiva or sclera are noted.  NECK: There is no jugular venous distension. Carotids are palpable and without bruits. There is no thyroid enlargement.  Chest: Lung fields are clear to auscultation. There are no wheezes or crackles.  CV: There is a regular rhythm. The first and second heart sounds are normal. There are no murmurs, gallops or rubs.  ABD: The abdomen is soft and supple with normal bowel sounds. There is no hepatosplenomegaly, ascites, tenderness, masses or bruits.  Neuro: There are no focal motor defects. Ambulation is normal. Cognitive function appears normal.  Ext: 1+ pitting edema left greater than right.   SKIN: There are no rashes and no cellulitis  PSYCH: The patient is calm, rationale and oriented.      No recent hosp. Admissions/ER visits.    No recent medication changes.    Most recent CV studies as follows:        Family History   Problem Relation Age of Onset   ? Stroke Mother 19   ? Hypertension Mother    ? High Cholesterol Mother    ? Other Brother         cardiac ablation   ? Hypertension Father    ? Diabetes Father    ? Hypoglycemia Father    ? Arthritis Maternal Grandmother    ? Tuberculosis Maternal Grandmother    ? Heart problem Paternal Grandmother    ? Dementia Paternal Grandfather  Social History     Socioeconomic History   ? Marital status: Married   Tobacco Use   ? Smoking status: Light Tobacco Smoker     Packs/day: 0.10     Types: Cigarettes   ? Smokeless tobacco: Never Used   Substance and Sexual Activity   ? Alcohol use: Yes     Alcohol/week: 3.0 standard drinks     Types: 2 Glasses of wine, 1 Cans of beer per week     Comment: occasionally monthly   ? Drug use: No            Vitals:    08/14/20 1136   BP: 139/83   BP Source: Arm, Left Upper   Pulse: 76   SpO2: 98%   PainSc: Zero   Weight: 112.9 kg (249 lb)   Height: 152.4 cm (5')     Body mass index is 48.63 kg/m?Marland Kitchen     Past Medical History  Patient Active Problem List Diagnosis Date Noted   ? Heart palpitations 09/27/2014   ? HTN (hypertension) 09/27/2014   ? Migraine without aura and without status migrainosus, not intractable 05/04/2014         Review of Systems   Constitutional: Negative.   HENT: Negative.    Eyes: Negative.    Cardiovascular: Negative.    Respiratory: Negative.    Endocrine: Negative.    Hematologic/Lymphatic: Negative.    Skin: Negative.    Musculoskeletal: Negative.    Gastrointestinal: Negative.    Genitourinary: Negative.    Neurological: Negative.    Psychiatric/Behavioral: Negative.    Allergic/Immunologic: Negative.        Physical Exam      Cardiovascular Studies      Cardiovascular Health Factors  Vitals BP Readings from Last 3 Encounters:   06/30/20 (!) 147/81   05/03/20 (!) 160/91   02/23/18 154/79     Wt Readings from Last 3 Encounters:   08/14/20 112.9 kg (249 lb)   06/30/20 113.4 kg (250 lb)   05/03/20 113.4 kg (250 lb)     BMI Readings from Last 3 Encounters:   08/14/20 48.63 kg/m?   06/30/20 48.82 kg/m?   05/03/20 48.82 kg/m?      Smoking Social History     Tobacco Use   Smoking Status Light Tobacco Smoker   ? Packs/day: 0.10   ? Types: Cigarettes   Smokeless Tobacco Never Used      Lipid Profile Cholesterol   Date Value Ref Range Status   11/09/2014 201 (H) 125 - 200 mg/dL Final   16/10/9602 540 (A) 200 Final     HDL   Date Value Ref Range Status   11/09/2014 60 > OR = 46 mg/dL Final   98/11/9145 60  Final     LDL   Date Value Ref Range Status   11/09/2014 107 <130 mg/dL (calc) Final     Comment:        Desirable range <100 mg/dL for patients with CHD or  diabetes and <70 mg/dL for diabetic patients with  known heart disease.        11/09/2014 107  Final     Triglycerides   Date Value Ref Range Status   11/09/2014 169 (H) <150 mg/dL Final   82/95/6213 086 (A) 150 Final      Blood Sugar No results found for: HGBA1C  No results found for: GLU, GLUF, GLUPOC       Problems Addressed Today  Encounter Diagnoses  Name Primary?   ? Primary hypertension Yes   ? Heart palpitations    ? Lower extremity edema        Assessment and Plan     1. Tachypalpitations.  Decreased burden of tachypalpitations with increasing Toprol-XL to 150 mg daily.  Single episode since her last meeting.  Prior Holter monitor unrevealing for any significant burden of dysrhythmias of note.  Recommendations to pursue a personal telemetry device given broad expanse of events.  Likely also consider pursuit of implantable loop recorder however would defer to a least invasive approach with monitor.  2. Lower extremity edema.  Prior history of chronic lower extremity edema likely of a chronic insufficiency perspective however reports of asymmetric left greater than right swelling status post a long car ride to [place].  Certainly risk factors for DVT.  Pursuit venous Doppler to assess.  3. Dyspnea/chest pain/chest discomfort.  Progressive symptoms of dyspnea on exertion coupled with chest pain.  She stands of intermediate risk for coronary artery disease.  Given her limited function capacity will pursue a pharmacologic perfusion study.  She does have a elevated BMI which certainly raises concerns for possible attenuation artifact.  Assess would recommend CT PET MPI for the patient's of optimizing her images and limit continuation impact on study results.  Moreover, can assess her underlying vascular system.  4. Elevated BMI.  Recommendation for lifestyle modification.  May benefit from a weight loss referral [facility].  5. Elevated blood pressure.  Min. elevated today.  However, per discussion blood pressure is well controlled.  That being said, is utilizing as needed clonidine at least twice a week.    6. Hypertension.  Pressures relatively controlled.     RTC in 3-6 months.  Total time spent on today's office visit was 40 minutes. This includes face-to-face in person visit with patient as well as nonface-to-face time including review of the EMR, outside records, labs, radiologic studies, echocardiogram & other cardiovascular studies, formulation of treatment plan, after visit summary, future disposition, personal discussions with patient and family, and lastly on documentation.    Current Medications (including today's revisions)  ? albuterol sulfate (PROAIR HFA) 90 mcg/actuation HFA aerosol inhaler Inhale 2 puffs by mouth into the lungs every 4 hours as needed for Wheezing or Shortness of Breath. Shake well before use.   ? brimonidine 0.025 % drop Place  into or around eye(s).   ? cetirizine-psuedoephedrine (ZYRTEC-D) 5-120 mg tablet Take 1 tablet by mouth twice daily.   ? cloNIDine HCl (CATAPRESS) 0.1 mg tablet Take 0.1 mg by mouth daily as needed.   ? dexlansoprazole (DEXILANT) 60 mg capsule Take 60 mg by mouth daily.   ? difluprednate(+) (DUREZOL) 0.05 % ophthalmic drop Place 1 drop into or around eye(s) four times daily.   ? ferrous gluconate 236 mg (27 mg iron) tab Take  by mouth daily.   ? fluconazole (DIFLUCAN) 100 mg tablet Take 100 mg by mouth daily.   ? fluticasone (FLONASE) 50 mcg/actuation nasal spray Apply 1 Spray to each nostril as directed daily. Shake bottle gently before using.   ? furosemide (LASIX) 20 mg tablet Take 20 mg by mouth daily.   ? gabapentin (NEURONTIN) 100 mg capsule Take one capsule by mouth every 8 hours.   ? hydrochlorothiazide (HYDRODIURIL) 25 mg tablet Take 25 mg by mouth twice daily.   ? metFORMIN-XR (GLUCOPHAGE XR) 500 mg extended release tablet Take 1,000 mg by mouth daily.   ? metoprolol succinate (TOPROL XL PO) Take 150  mg by mouth.   ? montelukast (SINGULAIR) 10 mg tablet Take 10 mg by mouth at bedtime daily.   ? naproxen (NAPROSYN) 500 mg tablet Take 500 mg by mouth every 12 hours as needed for Pain. Take with food.   ? nitrofurantoin monohyd/m-cryst (MACROBID) 100 mg capsule Take 100 mg by mouth every 12 hours. Take with food.   ? norgestimate-ethinyl estradiol (ORTHO-CYCLEN (28)) 0.25 mg-35 mcg tablet Take 1 tablet by mouth daily.   ? ondansetron hcl (ZOFRAN) 4 mg tablet Take 4 mg by mouth every 8 hours as needed for Nausea or Vomiting.   ? other medication 1 Dose. Plexus slim, plexus bio cleanse, plexus mega-x, plexas probio 5, plexus x factor, plexus ease, plexus nerve   ? oxyCODONE/acetaminophen (PERCOCET; ENDOCET; ROXICET) 5/325 mg tablet Take 1 Tab by mouth twice daily as needed for Pain   ? potassium chloride SR (K-DUR) 10 mEq tablet Take 10 mEq by mouth as Needed. Take with a meal and a full glass of water.   ? prochlorperazine maleate (COMPAZINE) 10 mg tablet Take 1 Tab by mouth every 8 hours as needed.   ? valACYclovir (VALTREX) 1 gram tablet Take 1,000 mg by mouth as Needed.

## 2020-09-22 ENCOUNTER — Encounter: Admit: 2020-09-22 | Discharge: 2020-09-22 | Payer: BC Managed Care – PPO

## 2020-09-22 DIAGNOSIS — R0683 Snoring: Secondary | ICD-10-CM

## 2020-09-22 NOTE — Progress Notes
Patient does not need a follow up with a Sleep provider at this time.

## 2020-09-25 ENCOUNTER — Encounter: Admit: 2020-09-25 | Discharge: 2020-09-25 | Payer: BC Managed Care – PPO

## 2020-09-27 ENCOUNTER — Encounter: Admit: 2020-09-27 | Discharge: 2020-09-27 | Payer: BC Managed Care – PPO

## 2020-09-27 NOTE — Telephone Encounter
-----   Message from Adonis Huguenin, MD sent at 09/27/2020  7:44 AM CDT -----  No evidence of sig sleep apnea thankfully.     Thanks,     jt

## 2020-09-27 NOTE — Telephone Encounter
Left patient a detailed message with sleep study results.
